# Patient Record
Sex: Female | Born: 1985 | Race: White | Hispanic: No | Marital: Single | Smoking: Never smoker
Health system: Southern US, Community
[De-identification: ages and names within clinical notes are randomized; demographics above are authoritative.]

---

## 2003-12-21 ENCOUNTER — Emergency Department (HOSPITAL_COMMUNITY): Admission: EM | Admit: 2003-12-21 | Discharge: 2003-12-22 | Payer: Self-pay | Admitting: Emergency Medicine

## 2004-11-30 ENCOUNTER — Encounter: Admission: RE | Admit: 2004-11-30 | Discharge: 2004-11-30 | Payer: Self-pay | Admitting: Obstetrics and Gynecology

## 2004-12-06 ENCOUNTER — Ambulatory Visit: Payer: Self-pay | Admitting: Gastroenterology

## 2004-12-16 ENCOUNTER — Ambulatory Visit: Payer: Self-pay | Admitting: Gastroenterology

## 2005-05-07 ENCOUNTER — Emergency Department (HOSPITAL_COMMUNITY): Admission: AD | Admit: 2005-05-07 | Discharge: 2005-05-07 | Payer: Self-pay | Admitting: Family Medicine

## 2005-10-03 ENCOUNTER — Emergency Department (HOSPITAL_COMMUNITY): Admission: EM | Admit: 2005-10-03 | Discharge: 2005-10-03 | Payer: Self-pay | Admitting: Family Medicine

## 2005-12-13 ENCOUNTER — Other Ambulatory Visit: Admission: RE | Admit: 2005-12-13 | Discharge: 2005-12-13 | Payer: Self-pay | Admitting: Obstetrics and Gynecology

## 2006-04-03 ENCOUNTER — Emergency Department (HOSPITAL_COMMUNITY): Admission: EM | Admit: 2006-04-03 | Discharge: 2006-04-03 | Payer: Self-pay | Admitting: Family Medicine

## 2006-05-25 ENCOUNTER — Emergency Department (HOSPITAL_COMMUNITY): Admission: EM | Admit: 2006-05-25 | Discharge: 2006-05-25 | Payer: Self-pay | Admitting: Family Medicine

## 2006-07-11 ENCOUNTER — Emergency Department (HOSPITAL_COMMUNITY): Admission: EM | Admit: 2006-07-11 | Discharge: 2006-07-11 | Payer: Self-pay | Admitting: Emergency Medicine

## 2006-07-29 ENCOUNTER — Emergency Department (HOSPITAL_COMMUNITY): Admission: EM | Admit: 2006-07-29 | Discharge: 2006-07-29 | Payer: Self-pay | Admitting: Emergency Medicine

## 2006-10-17 ENCOUNTER — Emergency Department (HOSPITAL_COMMUNITY): Admission: EM | Admit: 2006-10-17 | Discharge: 2006-10-17 | Payer: Self-pay | Admitting: Emergency Medicine

## 2006-12-11 ENCOUNTER — Emergency Department (HOSPITAL_COMMUNITY): Admission: EM | Admit: 2006-12-11 | Discharge: 2006-12-12 | Payer: Self-pay | Admitting: Emergency Medicine

## 2007-07-14 ENCOUNTER — Emergency Department (HOSPITAL_COMMUNITY): Admission: EM | Admit: 2007-07-14 | Discharge: 2007-07-14 | Payer: Self-pay | Admitting: Family Medicine

## 2007-09-29 ENCOUNTER — Emergency Department (HOSPITAL_COMMUNITY): Admission: EM | Admit: 2007-09-29 | Discharge: 2007-09-29 | Payer: Self-pay | Admitting: Emergency Medicine

## 2007-12-06 ENCOUNTER — Emergency Department (HOSPITAL_COMMUNITY): Admission: EM | Admit: 2007-12-06 | Discharge: 2007-12-06 | Payer: Self-pay | Admitting: Emergency Medicine

## 2008-03-11 ENCOUNTER — Emergency Department (HOSPITAL_COMMUNITY): Admission: EM | Admit: 2008-03-11 | Discharge: 2008-03-11 | Payer: Self-pay | Admitting: Family Medicine

## 2008-05-26 ENCOUNTER — Ambulatory Visit (HOSPITAL_COMMUNITY): Admission: RE | Admit: 2008-05-26 | Discharge: 2008-05-26 | Payer: Self-pay | Admitting: Urology

## 2008-06-16 ENCOUNTER — Emergency Department (HOSPITAL_COMMUNITY): Admission: EM | Admit: 2008-06-16 | Discharge: 2008-06-16 | Payer: Self-pay | Admitting: Emergency Medicine

## 2008-10-08 ENCOUNTER — Emergency Department (HOSPITAL_COMMUNITY): Admission: EM | Admit: 2008-10-08 | Discharge: 2008-10-08 | Payer: Self-pay | Admitting: Family Medicine

## 2008-11-28 ENCOUNTER — Emergency Department (HOSPITAL_COMMUNITY): Admission: EM | Admit: 2008-11-28 | Discharge: 2008-11-28 | Payer: Self-pay | Admitting: Family Medicine

## 2010-08-28 LAB — POCT URINALYSIS DIP (DEVICE)
Glucose, UA: NEGATIVE mg/dL
Ketones, ur: NEGATIVE mg/dL
Specific Gravity, Urine: 1.025 (ref 1.005–1.030)

## 2010-08-28 LAB — POCT PREGNANCY, URINE: Preg Test, Ur: NEGATIVE

## 2010-08-30 LAB — POCT URINALYSIS DIP (DEVICE)
Bilirubin Urine: NEGATIVE
Glucose, UA: NEGATIVE mg/dL
Hgb urine dipstick: NEGATIVE
Ketones, ur: NEGATIVE mg/dL
Nitrite: NEGATIVE
Protein, ur: NEGATIVE mg/dL
Specific Gravity, Urine: 1.015 (ref 1.005–1.030)
Urobilinogen, UA: 1 mg/dL (ref 0.0–1.0)

## 2010-08-30 LAB — POCT PREGNANCY, URINE: Preg Test, Ur: NEGATIVE

## 2010-08-30 LAB — URINE CULTURE

## 2011-02-10 LAB — GC/CHLAMYDIA PROBE AMP, GENITAL: Chlamydia, DNA Probe: NEGATIVE

## 2011-02-10 LAB — POCT URINALYSIS DIP (DEVICE)
Nitrite: NEGATIVE
Protein, ur: NEGATIVE
Urobilinogen, UA: 0.2

## 2011-02-17 LAB — POCT URINALYSIS DIP (DEVICE)
Bilirubin Urine: NEGATIVE
Hgb urine dipstick: NEGATIVE
Nitrite: NEGATIVE
Operator id: 247071
Protein, ur: NEGATIVE
Specific Gravity, Urine: 1.015

## 2011-02-20 LAB — URINE CULTURE: Colony Count: 25000

## 2011-02-20 LAB — POCT URINALYSIS DIP (DEVICE)
Glucose, UA: NEGATIVE
Protein, ur: NEGATIVE
Urobilinogen, UA: 0.2
pH: 7

## 2011-03-06 LAB — BASIC METABOLIC PANEL
BUN: 10
CO2: 25
Calcium: 10
Chloride: 108
Creatinine, Ser: 0.74
GFR calc Af Amer: >60
GFR calc non Af Amer: >60
Glucose, Bld: 114 — ABNORMAL HIGH
Potassium: 4.1
Sodium: 141

## 2011-03-06 LAB — URINALYSIS, ROUTINE W REFLEX MICROSCOPIC
Bilirubin Urine: NEGATIVE
Glucose, UA: NEGATIVE
Ketones, ur: NEGATIVE
Leukocytes, UA: NEGATIVE
Nitrite: NEGATIVE
Protein, ur: NEGATIVE
Specific Gravity, Urine: 1.031 — ABNORMAL HIGH
Urobilinogen, UA: 0.2
pH: 5.5

## 2011-03-06 LAB — PREGNANCY, URINE: Preg Test, Ur: NEGATIVE

## 2011-03-06 LAB — CBC
HCT: 38.6
Hemoglobin: 13.8
MCHC: 35.7
Platelets: 232

## 2011-03-06 LAB — URINE MICROSCOPIC-ADD ON

## 2011-03-06 LAB — DIFFERENTIAL
Basophils Absolute: 0
Eosinophils Relative: 1
Lymphocytes Relative: 57 — ABNORMAL HIGH
Neutro Abs: 1.8
Neutrophils Relative %: 32 — ABNORMAL LOW

## 2015-03-13 ENCOUNTER — Emergency Department (HOSPITAL_BASED_OUTPATIENT_CLINIC_OR_DEPARTMENT_OTHER): Payer: 59

## 2015-03-13 ENCOUNTER — Encounter (HOSPITAL_BASED_OUTPATIENT_CLINIC_OR_DEPARTMENT_OTHER): Payer: Self-pay | Admitting: *Deleted

## 2015-03-13 ENCOUNTER — Emergency Department (HOSPITAL_BASED_OUTPATIENT_CLINIC_OR_DEPARTMENT_OTHER)
Admission: EM | Admit: 2015-03-13 | Discharge: 2015-03-14 | Disposition: A | Discharge status: Home / Self Care | Payer: 59 | Attending: Emergency Medicine | Admitting: Emergency Medicine

## 2015-03-13 DIAGNOSIS — R3915 Urgency of urination: Secondary | ICD-10-CM | POA: Diagnosis not present

## 2015-03-13 DIAGNOSIS — Z3202 Encounter for pregnancy test, result negative: Secondary | ICD-10-CM | POA: Diagnosis not present

## 2015-03-13 DIAGNOSIS — Z792 Long term (current) use of antibiotics: Secondary | ICD-10-CM | POA: Insufficient documentation

## 2015-03-13 DIAGNOSIS — Z79899 Other long term (current) drug therapy: Secondary | ICD-10-CM | POA: Insufficient documentation

## 2015-03-13 DIAGNOSIS — R1031 Right lower quadrant pain: Secondary | ICD-10-CM | POA: Diagnosis present

## 2015-03-13 DIAGNOSIS — R110 Nausea: Secondary | ICD-10-CM | POA: Insufficient documentation

## 2015-03-13 LAB — URINALYSIS, ROUTINE W REFLEX MICROSCOPIC
GLUCOSE, UA: NEGATIVE mg/dL
HGB URINE DIPSTICK: NEGATIVE
Ketones, ur: 40 mg/dL — AB
Nitrite: POSITIVE — AB
Protein, ur: 30 mg/dL — AB
SPECIFIC GRAVITY, URINE: 1.025 (ref 1.005–1.030)
Urobilinogen, UA: 4 mg/dL — ABNORMAL HIGH (ref 0.0–1.0)
pH: 5 (ref 5.0–8.0)

## 2015-03-13 LAB — URINE MICROSCOPIC-ADD ON

## 2015-03-13 LAB — PREGNANCY, URINE: Preg Test, Ur: NEGATIVE

## 2015-03-13 MED ORDER — ONDANSETRON 8 MG PO TBDP: 8.0000 mg | ORAL_TABLET | Freq: Three times a day (TID) | ORAL | Status: DC | PRN

## 2015-03-13 NOTE — ED Notes (Signed)
Pt with right lower abd /pelvic pressure and right flank pain. Has been treated for recurrent UTI x 6 weeks with multiple abx. Reports nausea as well. Denies burning with urination fever vag bleeding or DC. - pelvic x 2 in last 2 weeks.

## 2015-03-13 NOTE — ED Provider Notes (Addendum)
CSN: [id number]     Arrival date & time 03/13/15  2138 History  By signing my name below, I, Gloria Davila, attest that this documentation has been prepared under the direction and in the presence of Gloria Libra, MD. Electronically Signed: Ronney Davila, ED Scribe. 03/13/2015. 11:55 PM.    Chief Complaint  Patient presents with  . Abdominal Pain    The history is provided by the patient.    HPI Comments: Gloria Davila is a 29 y.o. female who presents to the Emergency Department complaining of intermittent mild to moderate RLQ pain with pelvic pressure that began 2 months ago. Associated symptoms include  urinary urgency, and nausea. She states the RLQ pain seems unrelated to her urinary symptoms. She reports she was treated for a recurrent UTI over the past 2 months with Keflex, Septra and Levaquin, respectively. She notes that her latest UTI symptoms occurred 3 days ago after having a pap smear done, so she has been taking doxycycline to try to treat it. She notes she was treated for yeast infection earlier this month. Patient has had urine cultures done which came back negative.  Denies burning with urination, fever, chills, vomiting, vaginal bleeding or vaginal discharge.  History reviewed. No pertinent past medical history. History reviewed. No pertinent past surgical history. History reviewed. No pertinent family history. Social History  Substance Use Topics  . Smoking status: Never Smoker   . Smokeless tobacco: None  . Alcohol Use: No   OB History    No data available     Review of Systems A complete 10 system review of systems was obtained and all systems are negative except as noted in the HPI and PMH.    Allergies  Levaquin  Home Medications   Prior to Admission medications   Medication Sig Start Date End Date Taking? Authorizing Provider  lactobacillus acidophilus (BACID) TABS tablet Take 2 tablets by mouth 3 (three) times daily.   Yes Historical Provider, MD  Multiple  Vitamins-Minerals (MULTIVITAMIN WITH MINERALS) tablet Take 1 tablet by mouth daily.   Yes Historical Provider, MD  omeprazole (PRILOSEC) 40 MG capsule Take 40 mg by mouth daily.   Yes Historical Provider, MD  phentermine 15 MG capsule Take 15 mg by mouth every morning.   Yes Historical Provider, MD  sulfamethoxazole-trimethoprim (BACTRIM DS,SEPTRA DS) 800-160 MG tablet Take 1 tablet by mouth 2 (two) times daily. Takes 1/2tablet daily to prevent UTI   Yes Historical Provider, MD   BP 144/82 mmHg  Pulse 98  Temp(Src) 98.2 F (36.8 C) (Oral)  Resp 20  Ht  (1.6 m)  Wt 284 lb (128.822 kg)  BMI 50.32 kg/m2  SpO2 99%  LMP 02/20/2015 Physical Exam  Nursing note and vitals reviewed.   General: Well-developed, well-nourished female in no acute distress; appearance consistent with age of record HENT: normocephalic; atraumatic Eyes: pupils equal, round and reactive to light; extraocular muscles intact Neck: supple Heart: regular rate and rhythm; no murmurs, rubs or gallops Lungs: clear to auscultation bilaterally Abdomen: RLQ tenderness; soft; nondistended; no masses or hepatosplenomegaly; bowel sounds present Extremities: No deformity; full range of motion; pulses normal Neurologic: Awake, alert and oriented; motor function intact in all extremities and symmetric; no facial droop Skin: Warm and dry Psychiatric: Normal mood and affect  ED Course  Procedures (including critical care time)  DIAGNOSTIC STUDIES: Oxygen Saturation is 99% on RA, normal by my interpretation.     MDM   Nursing notes and vitals signs, including  pulse oximetry, reviewed.  Summary of this visit's results, reviewed by myself:  Labs:  Results for orders placed or performed during the hospital encounter of 03/13/15 (from the past 24 hour(s))  Urinalysis, Routine w reflex microscopic (not at [facility])     Status: Abnormal   Collection Time: 03/13/15  9:55 PM  Result Value Ref Range   Color, Urine RED (A) YELLOW    APPearance CLOUDY (A) CLEAR   Specific Gravity, Urine 1.025 1.005 - 1.030   pH 5.0 5.0 - 8.0   Glucose, UA NEGATIVE NEGATIVE mg/dL   Hgb urine dipstick NEGATIVE NEGATIVE   Bilirubin Urine MODERATE (A) NEGATIVE   Ketones, ur 40 (A) NEGATIVE mg/dL   Protein, ur 30 (A) NEGATIVE mg/dL   Urobilinogen, UA 4.0 (H) 0.0 - 1.0 mg/dL   Nitrite POSITIVE (A) NEGATIVE   Leukocytes, UA MODERATE (A) NEGATIVE  Pregnancy, urine     Status: None   Collection Time: 03/13/15  9:55 PM  Result Value Ref Range   Preg Test, Ur NEGATIVE NEGATIVE  Urine microscopic-add on     Status: Abnormal   Collection Time: 03/13/15  9:55 PM  Result Value Ref Range   Squamous Epithelial / LPF FEW (A) RARE   WBC, UA 0-2 <3 WBC/hpf   RBC / HPF 0-2 <3 RBC/hpf   Bacteria, UA MANY (A) RARE    Imaging Studies: [place] Transvaginal Non-ob  03/13/2015  CLINICAL DATA:  Initial valuation for intermittent right lower quadrant pain for 2 months. History of chronic UTIs. EXAM: TRANSABDOMINAL AND TRANSVAGINAL ULTRASOUND OF PELVIS TECHNIQUE: Both transabdominal and transvaginal ultrasound examinations of the pelvis were performed. Transabdominal technique was performed for global imaging of the pelvis including uterus, ovaries, adnexal regions, and pelvic cul-de-sac. It was necessary to proceed with endovaginal exam following the transabdominal exam to visualize the uterus and ovaries. COMPARISON:  Prior CT from 05/25/2008 FINDINGS: Uterus Measurements: 8.1 x 3.9 x 4.6 cm. No fibroids or other mass visualized. Endometrium Thickness: 7.7 mm.  No focal abnormality visualized. Right ovary Measurements: 3.4 x 2.2 x 2.2 cm. Normal appearance/no adnexal mass. Left ovary Measurements: 2.2 x 1.4 x 2.1 cm. Normal appearance/no adnexal mass. Other findings No free fluid. IMPRESSION: Normal pelvic ultrasound.  No acute abnormality identified. Electronically Signed   By: [name]  McClintock M.D.   On: 03/13/2015 23:43   [place] Pelvis Complete  03/13/2015   CLINICAL DATA:  Initial valuation for intermittent right lower quadrant pain for 2 months. History of chronic UTIs. EXAM: TRANSABDOMINAL AND TRANSVAGINAL ULTRASOUND OF PELVIS TECHNIQUE: Both transabdominal and transvaginal ultrasound examinations of the pelvis were performed. Transabdominal technique was performed for global imaging of the pelvis including uterus, ovaries, adnexal regions, and pelvic cul-de-sac. It was necessary to proceed with endovaginal exam following the transabdominal exam to visualize the uterus and ovaries. COMPARISON:  Prior CT from 05/25/2008 FINDINGS: Uterus Measurements: 8.1 x 3.9 x 4.6 cm. No fibroids or other mass visualized. Endometrium Thickness: 7.7 mm.  No focal abnormality visualized. Right ovary Measurements: 3.4 x 2.2 x 2.2 cm. Normal appearance/no adnexal mass. Left ovary Measurements: 2.2 x 1.4 x 2.1 cm. Normal appearance/no adnexal mass. Other findings No free fluid. IMPRESSION: Normal pelvic ultrasound.  No acute abnormality identified. Electronically Signed   By: [name]  McClintock M.D.   On: 03/13/2015 23:43   11:55 PM Patient was advised of unremarkable ultrasound findings and urinalysis not diagnostic of a urinary tract infection. It was sent for culture but antibiotics are not indicated at this time  especially given she has had several antibiotics without relief of symptoms and without positive urine cultures.   Final diagnoses:  RLQ abdominal pain   I personally performed the services described in this documentation, which was scribed in my presence. The recorded information has been reviewed and is accurate.    Gloria Libra, MD 03/13/15 2357  Gloria Libra, MD 03/14/15 Marlyne Beards

## 2015-03-14 NOTE — ED Notes (Signed)
Patient stable and ambulatory. Patient verbalizes understanding of discharge instructions and follow-up. 

## 2015-03-16 LAB — URINE CULTURE

## 2016-02-08 ENCOUNTER — Ambulatory Visit (HOSPITAL_BASED_OUTPATIENT_CLINIC_OR_DEPARTMENT_OTHER)
Admission: RE | Admit: 2016-02-08 | Discharge: 2016-02-08 | Disposition: A | Discharge status: Home / Self Care | Payer: BLUE CROSS/BLUE SHIELD | Source: Ambulatory Visit | Attending: Podiatry | Admitting: Podiatry

## 2016-02-08 ENCOUNTER — Ambulatory Visit (INDEPENDENT_AMBULATORY_CARE_PROVIDER_SITE_OTHER): Payer: BLUE CROSS/BLUE SHIELD | Admitting: Podiatry

## 2016-02-08 ENCOUNTER — Encounter: Payer: Self-pay | Admitting: Podiatry

## 2016-02-08 DIAGNOSIS — M7731 Calcaneal spur, right foot: Secondary | ICD-10-CM | POA: Insufficient documentation

## 2016-02-08 DIAGNOSIS — M722 Plantar fascial fibromatosis: Secondary | ICD-10-CM | POA: Diagnosis not present

## 2016-02-08 DIAGNOSIS — R52 Pain, unspecified: Secondary | ICD-10-CM | POA: Diagnosis not present

## 2016-02-08 MED ORDER — METHYLPREDNISOLONE 4 MG PO TBPK: ORAL_TABLET | ORAL | 0 refills | Status: AC

## 2016-02-08 MED ORDER — MELOXICAM 15 MG PO TABS: 15.0000 mg | ORAL_TABLET | Freq: Every day | ORAL | 2 refills | Status: AC

## 2016-02-08 NOTE — Patient Instructions (Signed)
Ice the injection spot today You can start taking the medrol dose pack (steroid). Once this is complete you can start the mobic (meloxicam). If your acid reflux gets worse, stop the mobic.    Plantar Fasciitis With Rehab The plantar fascia is a fibrous, ligament-like, soft-tissue structure that spans the bottom of the foot. Plantar fasciitis, also called heel spur syndrome, is a condition that causes pain in the foot due to inflammation of the tissue. SYMPTOMS   Pain and tenderness on the underneath side of the foot.  Pain that worsens with standing or walking. CAUSES  Plantar fasciitis is caused by irritation and injury to the plantar fascia on the underneath side of the foot. Common mechanisms of injury include:  Direct trauma to bottom of the foot.  Damage to a small nerve that runs under the foot where the main fascia attaches to the heel bone.  Stress placed on the plantar fascia due to bone spurs. RISK INCREASES WITH:   Activities that place stress on the plantar fascia (running, jumping, pivoting, or cutting).  Poor strength and flexibility.  Improperly fitted shoes.  Tight calf muscles.  Flat feet.  Failure to warm-up properly before activity.  Obesity. PREVENTION  Warm up and stretch properly before activity.  Allow for adequate recovery between workouts.  Maintain physical fitness:  Strength, flexibility, and endurance.  Cardiovascular fitness.  Maintain a health body weight.  Avoid stress on the plantar fascia.  Wear properly fitted shoes, including arch supports for individuals who have flat feet. PROGNOSIS  If treated properly, then the symptoms of plantar fasciitis usually resolve without surgery. However, occasionally surgery is necessary. RELATED COMPLICATIONS   Recurrent symptoms that may result in a chronic condition.  Problems of the lower back that are caused by compensating for the injury, such as limping.  Pain or weakness of the foot  during push-off following surgery.  Chronic inflammation, scarring, and partial or complete fascia tear, occurring more often from repeated injections. TREATMENT  Treatment initially involves the use of ice and medication to help reduce pain and inflammation. The use of strengthening and stretching exercises may help reduce pain with activity, especially stretches of the Achilles tendon. These exercises may be performed at home or with a therapist. Your caregiver may recommend that you use heel cups of arch supports to help reduce stress on the plantar fascia. Occasionally, corticosteroid injections are given to reduce inflammation. If symptoms persist for greater than 6 months despite non-surgical (conservative), then surgery may be recommended.  MEDICATION   If pain medication is necessary, then nonsteroidal anti-inflammatory medications, such as aspirin and ibuprofen, or other minor pain relievers, such as acetaminophen, are often recommended.  Do not take pain medication within 7 days before surgery.  Prescription pain relievers may be given if deemed necessary by your caregiver. Use only as directed and only as much as you need.  Corticosteroid injections may be given by your caregiver. These injections should be reserved for the most serious cases, because they may only be given a certain number of times. HEAT AND COLD  Cold treatment (icing) relieves pain and reduces inflammation. Cold treatment should be applied for 10 to 15 minutes every 2 to 3 hours for inflammation and pain and immediately after any activity that aggravates your symptoms. Use ice packs or massage the area with a piece of ice (ice massage).  Heat treatment may be used prior to performing the stretching and strengthening activities prescribed by your caregiver, physical therapist, or  Event organiser. Use a heat pack or soak the injury in warm water. SEEK IMMEDIATE MEDICAL CARE IF:  Treatment seems to offer no benefit,  or the condition worsens.  Any medications produce adverse side effects. EXERCISES RANGE OF MOTION (ROM) AND STRETCHING EXERCISES - Plantar Fasciitis (Heel Spur Syndrome) These exercises may help you when beginning to rehabilitate your injury. Your symptoms may resolve with or without further involvement from your physician, physical therapist or athletic trainer. While completing these exercises, remember:   Restoring tissue flexibility helps normal motion to return to the joints. This allows healthier, less painful movement and activity.  An effective stretch should be held for at least 30 seconds.  A stretch should never be painful. You should only feel a gentle lengthening or release in the stretched tissue. RANGE OF MOTION - Toe Extension, Flexion  Sit with your right / left leg crossed over your opposite knee.  Grasp your toes and gently pull them back toward the top of your foot. You should feel a stretch on the bottom of your toes and/or foot.  Hold this stretch for __________ seconds.  Now, gently pull your toes toward the bottom of your foot. You should feel a stretch on the top of your toes and or foot.  Hold this stretch for __________ seconds. Repeat __________ times. Complete this stretch __________ times per day.  RANGE OF MOTION - Ankle Dorsiflexion, Active Assisted  Remove shoes and sit on a chair that is preferably not on a carpeted surface.  Place right / left foot under knee. Extend your opposite leg for support.  Keeping your heel down, slide your right / left foot back toward the chair until you feel a stretch at your ankle or calf. If you do not feel a stretch, slide your bottom forward to the edge of the chair, while still keeping your heel down.  Hold this stretch for __________ seconds. Repeat __________ times. Complete this stretch __________ times per day.  STRETCH - Gastroc, Standing  Place hands on wall.  Extend right / left leg, keeping the front  knee somewhat bent.  Slightly point your toes inward on your back foot.  Keeping your right / left heel on the floor and your knee straight, shift your weight toward the wall, not allowing your back to arch.  You should feel a gentle stretch in the right / left calf. Hold this position for __________ seconds. Repeat __________ times. Complete this stretch __________ times per day. STRETCH - Soleus, Standing  Place hands on wall.  Extend right / left leg, keeping the other knee somewhat bent.  Slightly point your toes inward on your back foot.  Keep your right / left heel on the floor, bend your back knee, and slightly shift your weight over the back leg so that you feel a gentle stretch deep in your back calf.  Hold this position for __________ seconds. Repeat __________ times. Complete this stretch __________ times per day. STRETCH - Gastrocsoleus, Standing  Note: This exercise can place a lot of stress on your foot and ankle. Please complete this exercise only if specifically instructed by your caregiver.   Place the ball of your right / left foot on a step, keeping your other foot firmly on the same step.  Hold on to the wall or a rail for balance.  Slowly lift your other foot, allowing your body weight to press your heel down over the edge of the step.  You should feel a  stretch in your right / left calf.  Hold this position for __________ seconds.  Repeat this exercise with a slight bend in your right / left knee. Repeat __________ times. Complete this stretch __________ times per day.  STRENGTHENING EXERCISES - Plantar Fasciitis (Heel Spur Syndrome)  These exercises may help you when beginning to rehabilitate your injury. They may resolve your symptoms with or without further involvement from your physician, physical therapist or athletic trainer. While completing these exercises, remember:   Muscles can gain both the endurance and the strength needed for everyday  activities through controlled exercises.  Complete these exercises as instructed by your physician, physical therapist or athletic trainer. Progress the resistance and repetitions only as guided. STRENGTH - Towel Curls  Sit in a chair positioned on a non-carpeted surface.  Place your foot on a towel, keeping your heel on the floor.  Pull the towel toward your heel by only curling your toes. Keep your heel on the floor.  If instructed by your physician, physical therapist or athletic trainer, add ____________________ at the end of the towel. Repeat __________ times. Complete this exercise __________ times per day. STRENGTH - Ankle Inversion  Secure one end of a rubber exercise band/tubing to a fixed object (table, pole). Loop the other end around your foot just before your toes.  Place your fists between your knees. This will focus your strengthening at your ankle.  Slowly, pull your big toe up and in, making sure the band/tubing is positioned to resist the entire motion.  Hold this position for __________ seconds.  Have your muscles resist the band/tubing as it slowly pulls your foot back to the starting position. Repeat __________ times. Complete this exercises __________ times per day.    This information is not intended to replace advice given to you by your health care provider. Make sure you discuss any questions you have with your health care provider.   Document Released: 05/08/2005 Document Revised: 09/22/2014 Document Reviewed: 08/20/2008 Elsevier Interactive Patient Education [employer]

## 2016-02-08 NOTE — Progress Notes (Signed)
   Subjective:    Patient ID: Gloria Davila, female    DOB: 04-05-1986, 30 y.o.   MRN: [id number]  HPI  30 year old female presents the office today for concerns of right heel pain which has been ongoing for 6-12 months. She states the pain started after she was running out of boot camp and she was not wearing shoes. Her trainer did send her to physical therapy and she did this for some time which included stretching, dry needling as well as a walking boot without significant improvement in symptoms. Denies any swelling or redness. No numbness or tingling. The pain does not wake up at night. She's had no other treatment. She describes the pain mostly in the morning when she first gets up and at the end of the day after being on her feet all day.   Review of Systems  All other systems reviewed and are negative.      Objective:   Physical Exam General: AAO x3, NAD  Dermatological: Skin is warm, dry and supple bilateral. Nails x 10 are well manicured; remaining integument appears unremarkable at this time. There are no open sores, no preulcerative lesions, no rash or signs of infection present.  Vascular: Dorsalis Pedis artery and Posterior Tibial artery pedal pulses are 2/4 bilateral with immedate capillary fill time.There is no pain with calf compression, swelling, warmth, erythema.   Neruologic: Grossly intact via light touch bilateral. Vibratory intact via tuning fork bilateral. Protective threshold with Semmes Wienstein monofilament intact to all pedal sites bilateral.   Musculoskeletal: Tenderness to palpation along the plantar medial tubercle of the calcaneus at the insertion of plantar fascia on the right foot. There is no pain along the course of the plantar fascia within the arch of the foot. Plantar fascia appears to be intact. There is no pain with lateral compression of the calcaneus or pain with vibratory sensation. There is no pain along the course or insertion of the achilles tendon.  No other areas of tenderness to bilateral lower extremities. Muscular strength 5/5 in all groups tested bilateral.  Gait: Unassisted, Nonantalgic.      Assessment & Plan:  30 year old female right heel pain, likely plantar fasciitis, heel spur -Treatment options discussed including all alternatives, risks, and complications -Etiology of symptoms were discussed -X-rays ordered and reviewed with the patient. -Patient elects to proceed with steroid injection into the right heel. Under sterile skin preparation, a total of 2.5cc of kenalog 10, 0.5% Marcaine plain, and 2% lidocaine plain were infiltrated into the symptomatic area without complication. A band-aid was applied. Patient tolerated the injection well without complication. Post-injection care with discussed with the patient. Discussed with the patient to ice the area over the next couple of days to help prevent a steroid flare.  -Will start Medrol Dosepak. Once this is complete she can start meloxicam. Should not it together. Discussed side effects of each of the medicines. -Ice to the area -Stretching exercises daily -Plantar fascial brace dispensed -Discussed shoe gear modifications and orthotics. She wishes to proceed with custom inserts days. [facility] -Follow-up in 4 weeks or sooner if needed. Call any questions or concerns meantime.  Ovid CurdMatthew Wagoner, DPM

## 2016-03-07 ENCOUNTER — Encounter: Payer: Self-pay | Admitting: Podiatry

## 2016-03-07 ENCOUNTER — Ambulatory Visit (INDEPENDENT_AMBULATORY_CARE_PROVIDER_SITE_OTHER): Payer: BLUE CROSS/BLUE SHIELD | Admitting: Podiatry

## 2016-03-07 DIAGNOSIS — M722 Plantar fascial fibromatosis: Secondary | ICD-10-CM | POA: Diagnosis not present

## 2016-03-07 NOTE — Progress Notes (Signed)
Subjective: Gloria Davila presents to the office today for follow-up evaluation of right heel pain. She states that she is doing much better but she is still having some pain in the right heel and she is requesting another steroid injection. She has been continuing stretching she still feels tight her calf muscle and she stretches. Her plantar fascial brace to break the other day. She has a presents a pickup orthotics. No other complaints at this time. No acute changes since last appointment. They deny any systemic complaints such as fevers, chills, nausea, vomiting.  Objective: General: AAO x3, NAD  Dermatological: Skin is warm, dry and supple bilateral. Nails x 10 are well manicured; remaining integument appears unremarkable at this time. There are no open sores, no preulcerative lesions, no rash or signs of infection present.  Vascular: Dorsalis Pedis artery and Posterior Tibial artery pedal pulses are 2/4 bilateral with immedate capillary fill time. Pedal hair growth present. There is no pain with calf compression, swelling, warmth, erythema.   Neruologic: Grossly intact via light touch bilateral. Vibratory intact via tuning fork bilateral. Protective threshold with Semmes Wienstein monofilament intact to all pedal sites bilateral.   Musculoskeletal: There is improved but continued tenderness palpation along the plantar medial tubercle of the calcaneus at the insertion of the plantar fascia on the left  foot. There is no pain along the course of the plantar fascia within the arch of the foot. Plantar fascia appears to be intact bilaterally. There is no pain with lateral compression of the calcaneus and there is no pain with vibratory sensation. There is no pain along the course or insertion of the Achilles tendon. There are no other areas of tenderness to bilateral lower extremities. No gross boney pedal deformities bilateral. No pain, crepitus, or limitation noted with foot and ankle range of motion  bilateral. Muscular strength 5/5 in all groups tested bilateral.  Gait: Unassisted, Nonantalgic.   Assessment: Presents for follow-up evaluation for heel pain, likely plantar fasciitis; heel spur  Plan: -Treatment options discussed including all alternatives, risks, and complications -Patient elects to proceed with steroid injection into the left heel. Under sterile skin preparation, a total of 2.5cc of kenalog 10, 0.5% Marcaine plain, and 2% lidocaine plain were infiltrated into the symptomatic area without complication. A band-aid was applied. Patient tolerated the injection well without complication. Post-injection care with discussed with the patient. Discussed with the patient to ice the area over the next couple of days to help prevent a steroid flare.  -Night splint -Orthotics dispensed. Oral and written break-in instructions discussed today.  -Ice and stretching exercises on a daily basis. -Continue supportive shoe gear. -Follow-up in 4 weeks or sooner if any problems arise. In the meantime, encouraged to call the office with any questions, concerns, change in symptoms.   Ovid CurdMatthew Wagoner, DPM

## 2016-03-07 NOTE — Patient Instructions (Signed)

## 2016-04-04 ENCOUNTER — Encounter: Payer: Self-pay | Admitting: Podiatry

## 2016-04-04 ENCOUNTER — Ambulatory Visit (INDEPENDENT_AMBULATORY_CARE_PROVIDER_SITE_OTHER): Payer: BLUE CROSS/BLUE SHIELD | Admitting: Podiatry

## 2016-04-04 DIAGNOSIS — M722 Plantar fascial fibromatosis: Secondary | ICD-10-CM | POA: Diagnosis not present

## 2016-04-04 DIAGNOSIS — M779 Enthesopathy, unspecified: Secondary | ICD-10-CM | POA: Diagnosis not present

## 2016-04-05 NOTE — Progress Notes (Signed)
Subjective: Patient presents the office they for evaluation of bilateral heel pain. She states her heels no longer hurt her however the pain has moved to the outside aspect of the right foot. She doesn't wear the insert some but she feels that she is still rolling inwards. She has been stretching, icing as well. She is scheduled for bariatric surgery in January she was told that she did not have any more steroid injections. Denies any systemic complaints such as fevers, chills, nausea, vomiting. No acute changes since last appointment, and no other complaints at this time.   Objective: AAO x3, NAD DP/PT pulses palpable bilaterally, CRT less than 3 seconds At this time there is no tenderness palpation along the heels along the course of plantar fascia. There is no pain at all to the left foot today. On the right foot there is mild tenderness in the course of the peroneal tendons just proximal to the insertion of the fifth metatarsal base. The peroneal tendons to be intact. There is no overlying edema, erythema, increase in warmth. No other areas of tenderness bilaterally. No open lesions or pre-ulcerative lesions.  No pain with calf compression, swelling, warmth, erythema  Assessment: Peroneal tendinitis right foot with resolving pain/plan fasciitis  Plan: -All treatment options discussed with the patient including all alternatives, risks, complications.  -I did modify her inserts to add a medial post as she was still rolling in some the orthotic. Also discussed shoe changes. She's been wearing an old Engineer, petroleumike shoe. Continue ice. Anti-inflammatories until she was told to discontinue for her surgery. Also discussed physical therapy which she was told off on this. -Follow-up next 4-6 weeks if symptoms continue or sooner if needed. -Patient encouraged to call the office with any questions, concerns, change in symptoms.   Ovid CurdMatthew Wagoner, DPM

## 2017-06-28 ENCOUNTER — Encounter: Payer: Self-pay | Admitting: Podiatry

## 2017-06-28 ENCOUNTER — Ambulatory Visit: Payer: BLUE CROSS/BLUE SHIELD | Admitting: Podiatry

## 2017-06-28 DIAGNOSIS — M722 Plantar fascial fibromatosis: Secondary | ICD-10-CM | POA: Diagnosis not present

## 2017-06-28 NOTE — Patient Instructions (Signed)

## 2017-06-29 NOTE — Progress Notes (Signed)
Subjective: Margarett presents the office today for concerns of left heel pain.  She states that her right foot is 100% better but over the last 3 months she started to notice pain to the left foot.  She denies any recent injury or trauma.  She states the pain is worse in the morning she first gets up.  She states it feels exact same as it did on the right foot.  No numbness or tingling the pain does not wake her up at night. Denies any systemic complaints such as fevers, chills, nausea, vomiting. No acute changes since last appointment, and no other complaints at this time.   She recently has lost 90 pounds  Objective: AAO x3, NAD DP/PT pulses palpable bilaterally, CRT less than 3 seconds There is tenderness to palpation along the plantar medial tubercle of the calcaneus at the insertion of plantar fascia on the left foot. There is no pain to the right foot. There is no pain along the course of the plantar fascia within the arch of the foot. Plantar fascia appears to be intact. There is no pain with lateral compression of the calcaneus or pain with vibratory sensation. There is no pain along the course or insertion of the achilles tendon. No other areas of tenderness to bilateral lower extremities. Negative tinel sign.  Equinus is present. No open lesions or pre-ulcerative lesions.  No pain with calf compression, swelling, warmth, erythema  Assessment: Left plantar fasciitis  Plan: -All treatment options discussed with the patient including all alternatives, risks, complications.  -Declined x-rays today.  She states that she knows that this is the same as the right foot. -Patient requesting steroid injection today which were performed.  See procedure note below. -Plantar fascial brace was dispensed to the left foot. -Continue stretching, icing exercises daily. -We will consider physical therapy.  She did do this previously before I saw her on the right side.  Given her colitis this may be  beneficial for her. -Follow-up in 3 weeks or sooner if needed.  Call any questions or concerns meantime.  She has no further questions today. -Patient encouraged to call the office with any questions, concerns, change in symptoms.   Procedure: Injection Tendon/Ligament Discussed alternatives, risks, complications and verbal consent was obtained.  Location: Left plantar fascia at the glabrous junction; medial approach. Skin Prep: Alcohol. Injectate: 1 cc 0.5% marcaine plain, 1 cc 0.5% Marcaine plain and, 1 cc kenalog 10. Disposition: Patient tolerated procedure well. Injection site dressed with a band-aid.  Post-injection care was discussed and return precautions discussed.    Vivi BarrackMatthew R Wagoner DPM

## 2017-07-20 ENCOUNTER — Ambulatory Visit: Payer: BLUE CROSS/BLUE SHIELD | Admitting: Podiatry

## 2017-07-20 DIAGNOSIS — M722 Plantar fascial fibromatosis: Secondary | ICD-10-CM

## 2017-07-20 DIAGNOSIS — M7672 Peroneal tendinitis, left leg: Secondary | ICD-10-CM | POA: Diagnosis not present

## 2017-07-20 MED ORDER — METHYLPREDNISOLONE 4 MG PO TBPK: ORAL_TABLET | ORAL | 0 refills | Status: AC

## 2017-07-20 NOTE — Progress Notes (Signed)
Subjective: Gloria Davila presents the office today for follow-up evaluation of left foot pain.  She says the heel is doing better but she is getting pain more to the outside aspect of the ankle and foot.  At max her pain level is 8/10 and describes a sharp pain at times.  No recent injury or trauma.  No swelling or redness.  She had no complications with the last steroid injection.  She is been using a plantar fascial brace as well as stretching ice as much as possible.  Her heel is feeling better but again she states that the pain the also of the foot is what starting today. Denies any systemic complaints such as fevers, chills, nausea, vomiting. No acute changes since last appointment, and no other complaints at this time.   Objective: AAO x3, NAD DP/PT pulses palpable bilaterally, CRT less than 3 seconds At this time there is no significant discomfort to the plantar medial tubercle of the calcaneus at the insertion of the plantar fascia on the left foot.  The majority of tenderness appears to be along the course of the peroneal tendon inferior to the lateral malleolus along the insertion of the fifth metatarsal base.  There is no overlying edema, erythema, increase in warmth.  The tendon appears to be intact.  Plantar fascia appears to be intact.  Achilles tendon intact.  No other area tenderness. No open lesions or pre-ulcerative lesions.  No pain with calf compression, swelling, warmth, erythema  Assessment: Left foot insertional peroneal tendinitis likely result of compensation due to plantar fasciitis  Plan: -All treatment options discussed with the patient including all alternatives, risks, complications.  -At this time discussed a steroid injection into the fifth metatarsal base as this is where the majority of her tenderness is localized today.  She agrees with this and wishes to proceed.  Area was cleaned with alcohol and a mixture of 1 cc mixture of dexamethasone phosphate and 0.5% Marcaine plain  was infiltrated around the peroneal tendon sheath with care taken not to infiltrate directly into the tendon.  Tolerated well.  Postinjection care was discussed. -Continue with supportive shoes and inserts. -Medrol Dosepak prescribed. -Can return to the rehab exercises in the next couple of days but hold off for the first 2-3 days to allow the injection to work. -Patient encouraged to call the office with any questions, concerns, change in symptoms.   Vivi BarrackMatthew R Jabier Deese DPM

## 2017-07-20 NOTE — Patient Instructions (Signed)
Peroneal Tendinopathy Rehab  Ask your health care provider which exercises are safe for you. Do exercises exactly as told by your health care provider and adjust them as directed. It is normal to feel mild stretching, pulling, tightness, or discomfort as you do these exercises, but you should stop right away if you feel sudden pain or your pain gets worse.Do not begin these exercises until told by your health care provider.  Stretching and range of motion exercises  These exercises warm up your muscles and joints and improve the movement and flexibility of your ankle. These exercises also help to relieve pain and stiffness.  Exercise A: Gastroc and soleus, standing  1. Stand on the edge of a step on the balls of your feet. The ball of your foot is on the walking surface, right under your toes.  2. Hold onto the railing for balance.  3. Slowly lift your left / right foot, allowing your body weight to press your left / right heel down over the edge of the step. You should feel a stretch in your left / right calf.  4. Hold this position for __________ seconds.  Repeat __________ times with your left / right knee straight and __________ times with your left / right knee bent. Complete this stretch __________ times per day.  Strengthening exercises  These exercises improve the strength and endurance of your foot and ankle. Endurance is the ability to use your muscles for a long time, even after they get tired.  Exercise B: Dorsiflexors    1. Secure a rubber exercise band or tube to an object, like a table leg, that will not move if it is pulled on.  2. Secure the other end of the band around your left / right foot.  3. Sit on the floor, facing the object with your left / right foot extended. The band or tube should be slightly tense when your foot is relaxed.  4. Slowly flex your left / right ankle and toes to bring your foot toward you.  5. Hold this position for __________ seconds.  6. Slowly return your foot to the  starting position.  Repeat __________ times. Complete this exercise __________ times per day.  Exercise C: Evertors  1. Sit on the floor with your legs straight out in front of you.  2. Loop a rubber exercise or band or tube around the ball of your left / right foot. The ball of your foot is on the walking surface, right under your toes.  3. Hold the ends of the band in your hands, or secure the band to a stable object.  4. Slowly push your foot outward, away from your other leg.  5. Hold this position for __________ seconds.  6. Slowly return your foot to the starting position.  Repeat __________ times. Complete this exercise __________ times per day.  Exercise D: Standing heel raise (  plantar flexion)  1. Stand with your feet shoulder-width apart with the balls of your feet on a step. The ball of your foot is on the walking surface, right under your toes.  2. Keep your weight spread evenly over the width of your feet while you rise up on your toes. Use a wall or railing to steady yourself, but try not to use it for support.  3. If this exercise is too easy, try these options:  ? Shift your weight toward your left / right leg until you feel challenged.  ? If told by   your health care provider, stand on your left / right leg only.  4. Hold this position for __________ seconds.  Repeat __________ times. Complete this exercise __________ times per day.  Exercise E: Single leg stand  1. Without shoes, stand near a railing or in a doorway. You may hold onto the railing or door frame as needed.  2. Stand on your left / right foot. Keep your big toe down on the floor and try to keep your arch lifted.  ? Do not roll to the outside of your foot.  ? If this exercise is too easy, you can try it with your eyes closed or while standing on a pillow.  3. Hold this position for __________ seconds.  Repeat __________ times. Complete this exercise __________ times per day.  This information is not intended to replace advice given to  you by your health care provider. Make sure you discuss any questions you have with your health care provider.  Document Released: 05/08/2005 Document Revised: 01/13/2016 Document Reviewed: 03/27/2015  [employer]vier Interactive Patient Education  2018 [employer]

## 2017-08-20 ENCOUNTER — Encounter: Payer: Self-pay | Admitting: Podiatry

## 2017-08-20 ENCOUNTER — Ambulatory Visit: Payer: BLUE CROSS/BLUE SHIELD | Admitting: Podiatry

## 2017-08-20 DIAGNOSIS — M722 Plantar fascial fibromatosis: Secondary | ICD-10-CM | POA: Diagnosis not present

## 2017-08-20 NOTE — Patient Instructions (Signed)

## 2017-08-21 NOTE — Progress Notes (Signed)
Subjective: [name] presents the office today for follow-up evaluation of continued pain to the bottom of her left heel.  She states that the tendon issue inside of her foot has resolved after last injection she only gets pain to the outside aspect of the bottom of her heel.  She is hoping to get an injection to the outside aspect of her heel today.  The pain to the inside aspect of the heel is also resolved.  She feels that when she runs or walks she is rolling outwards putting more pressure to the outside of the heel.  No recent injury.  Right side is doing well. Denies any systemic complaints such as fevers, chills, nausea, vomiting. No acute changes since last appointment, and no other complaints at this time.   Objective: AAO x3, NAD DP/PT pulses palpable bilaterally, CRT less than 3 seconds There is tenderness palpation on the plantar lateral aspect of the heel and insertion of the plantar fascia on the left foot.  Plantar fascia appears to be intact.  There is no pain with lateral compression of the calcaneus.  No pain on the course or insertion of the peroneal tendon.  There is no overlying edema, erythema, increase in warmth.  Achilles tendon appears to be intact. No open lesions or pre-ulcerative lesions.  No pain with calf compression, swelling, warmth, erythema  Assessment: Left plantar fasciitis  Plan: -All treatment options discussed with the patient including all alternatives, risks, complications.  -She wishes to proceed with another steroid injection.  See procedure note below. -I added the lateral posterior her left orthotic to help prevent her from rolling out. -Continue stretching, icing exercises daily.  Anti-inflammatories as needed.  If symptoms continue discussed other treatment options including physical therapy, EPAT -RTC 3-4 weeks or sooner if needed -Patient encouraged to call the office with any questions, concerns, change in symptoms.   Procedure: Injection  Tendon/Ligament Discussed alternatives, risks, complications and verbal consent was obtained.  Location: Left plantar fascia at the glabrous junction; LATERAL approach. Skin Prep: Alcohol. Injectate: 0.5 cc 0.5% marcaine plain, 1 cc 2 % lidocaine plain and, 1 cc kenalog 10. Disposition: Patient tolerated procedure well. Injection site dressed with a band-aid.  Post-injection care was discussed and return precautions discussed.   Vivi BarrackMatthew R Wagoner DPM

## 2017-09-24 ENCOUNTER — Ambulatory Visit: Payer: BLUE CROSS/BLUE SHIELD | Admitting: Podiatry

## 2020-01-14 ENCOUNTER — Other Ambulatory Visit: Payer: Self-pay

## 2020-01-14 ENCOUNTER — Emergency Department
Admission: EM | Admit: 2020-01-14 | Discharge: 2020-01-14 | Disposition: A | Discharge status: Home / Self Care | Payer: BC Managed Care – PPO | Source: Home / Self Care | Attending: Internal Medicine | Admitting: Internal Medicine

## 2020-01-14 DIAGNOSIS — L03114 Cellulitis of left upper limb: Secondary | ICD-10-CM | POA: Diagnosis not present

## 2020-01-14 MED ORDER — CEPHALEXIN 500 MG PO CAPS: 500.0000 mg | ORAL_CAPSULE | Freq: Four times a day (QID) | ORAL | 0 refills | Status: AC

## 2020-01-14 MED ORDER — CEFTRIAXONE SODIUM 500 MG IJ SOLR
1000.0000 mg | Freq: Once | INTRAMUSCULAR | Status: AC
  Administered 2020-01-14: 1000 mg via INTRAMUSCULAR

## 2020-01-14 NOTE — ED Provider Notes (Signed)
[facility]    CSN: [id number] Arrival date & time: 01/14/20  1321      History   Chief Complaint Chief Complaint  Patient presents with  . Hand Pain    left middle    HPI Gloria Davila is an otherwise healthy 34 y.o. female presenting for evaluation of a finger lesion.   Reports she initially noticed swelling and erythema of her left middle finger at the nail bed on Monday evening.  She was evaluated via virtual appointment yesterday and felt that it may have been herpetic whitlow and started on acyclovir.  She unfortunately did not notice any improvement with this medication and now since late yesterday evening she noticed redness spreading up her entire forearm.  Arm is warm to the touch.  Febrile on Monday and now again febrile today, 100.65F in the office.  Otherwise feeling at baseline, no associated fatigue, malaise, change in mentation, or palpitations.  States she often picks at her fingers.  Of note, recently received her first [employer] vaccination this past Friday.   History reviewed. No pertinent past medical history.  Patient Active Problem List   Diagnosis Date Noted  . Plantar fasciitis 02/08/2016    History reviewed. No pertinent surgical history.  OB History   No obstetric history on file.      Home Medications    Prior to Admission medications   Medication Sig Start Date End Date Taking? Authorizing Provider  cephALEXin (KEFLEX) 500 MG capsule Take 1 capsule (500 mg total) by mouth 4 (four) times daily for 5 days. 01/14/20 01/19/20  Allayne Stack, DO  lactobacillus acidophilus (BACID) TABS tablet Take 2 tablets by mouth 3 (three) times daily.    [provider]  methylPREDNISolone (MEDROL DOSEPAK) 4 MG TBPK tablet Take as directed 02/08/16   Vivi Barrack, DPM  methylPREDNISolone (MEDROL DOSEPAK) 4 MG TBPK tablet Take as directed 07/20/17   Vivi Barrack, DPM  Multiple Vitamins-Minerals (MULTIVITAMIN WITH MINERALS) tablet  Take 1 tablet by mouth daily.    [provider]  omeprazole (PRILOSEC) 40 MG capsule Take 40 mg by mouth daily.    [provider]  phentermine 15 MG capsule Take 15 mg by mouth every morning.    [provider]    Family History Family History  Problem Relation Age of Onset  . Healthy Mother   . Diabetes Father   . Hypertension Father     Social History Social History   Tobacco Use  . Smoking status: Never Smoker  . Smokeless tobacco: Never Used  Substance Use Topics  . Alcohol use: No  . Drug use: No     Allergies   Patient has no known allergies.   Review of Systems Review of Systems  Constitutional: Positive for fever. Negative for chills and fatigue.  Respiratory: Negative for shortness of breath.   Cardiovascular: Positive for chest pain.  Gastrointestinal: Negative for nausea and vomiting.  Skin: Positive for rash and wound.  Neurological: Negative for dizziness, syncope, weakness and light-headedness.   Physical Exam Triage Vital Signs ED Triage Vitals  Enc Vitals Group     BP 01/14/20 1401 139/86     Pulse Rate 01/14/20 1401 99     Resp 01/14/20 1401 16     Temp 01/14/20 1401 100.1 F (37.8 C)     Temp Source 01/14/20 1401 Oral     SpO2 01/14/20 1401 100 %     Weight --  Height --      Head Circumference --      Peak Flow --      Pain Score 01/14/20 1358 7     Pain Loc --      Pain Edu? --      Excl. in GC? --    No data found.  Updated Vital Signs BP 139/86 (BP Location: Right Arm)   Pulse 99   Temp 100.1 F (37.8 C) (Oral)   Resp 16   SpO2 100%    Physical Exam Constitutional:      General: She is not in acute distress.    Appearance: Normal appearance. She is not ill-appearing.  HENT:     Mouth/Throat:     Mouth: Mucous membranes are moist.  Cardiovascular:     Pulses: Normal pulses.  Pulmonary:     Effort: Pulmonary effort is normal.  Skin:    General: Skin is warm and dry.     Capillary  Refill: Capillary refill takes less than 2 seconds.     Comments: Swelling and erythema present around lateral nailbed of left middle finger with dried drainage on lateral aspect.  Skin erythema extending from finger to elbow in streak-like pattern as pictured below.  Skin warm to touch in comparison to right arm.   Neurological:     General: No focal deficit present.     Mental Status: She is alert and oriented to person, place, and time.            UC Treatments / Results  Labs (all labs ordered are listed, but only abnormal results are displayed) Labs Reviewed - No data to display  EKG   Radiology No results found.  Procedures Procedures (including critical care time)  Medications Ordered in UC Medications  cefTRIAXone (ROCEPHIN) injection 1,000 mg (1,000 mg Intramuscular Given 01/14/20 1514)    Initial Impression / Assessment and Plan / UC Course  I have reviewed the triage vital signs and the nursing notes.  Pertinent labs & imaging results that were available during my care of the patient were reviewed by me and considered in my medical decision making (see chart for details).   Otherwise healthy 34 year old female presenting for evaluation of an acute finger lesion with spreading erythema, consistent with paronychia and subsequent left arm cellulitis.  Elevated temperature of 100.54F in the clinic today, but otherwise hemodynamically stable and well-appearing on exam thus will trial outpatient therapy.  Given rapid spreading of erythema, provided ceftriaxone IM today with outpatient Keflex course for an additional 5 days for suspected strep cellulitis.   Recommended close follow-up in the next two days via urgent care or with her primary care provider.  Tylenol/ibuprofen as needed for fever/discomfort.  May do warm water salt soaks and avoid nail picking.  Follow-up sooner if erythema spreading, persistently febrile > 24-48 hours following antibiotics, or developing  fatigue etc.  Final Clinical Impressions(s) / UC Diagnoses   Final diagnoses:  Cellulitis of left arm     Discharge Instructions     Wonderful to meet you today!  You have received antibiotics in the office today and we have sent an additional course of to your pharmacy for the next 5 days.  You can also continue soaks of your arm daily.  Tylenol and/or ibuprofen alternated every 4-6 hours to help with pain and fever.  Please follow-up in the next few days either via our office or your primary care provider to check in.  Follow-up sooner  if the redness continues to spread, persistent fever in the next 1-2 days, or feeling worsening fatigue.    ED Prescriptions    Medication Sig Dispense Auth. Provider   cephALEXin (KEFLEX) 500 MG capsule Take 1 capsule (500 mg total) by mouth 4 (four) times daily for 5 days. 20 capsule Allayne Stack, DO     PDMP not reviewed this encounter.   Allayne Stack, DO   [place], [place], [place] 01/14/20 1606

## 2020-01-14 NOTE — ED Triage Notes (Signed)
Patient presents to Urgent Care with complaints of left middle finger pain since a few days ago. 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 Since then, the redness and swelling has spread up her arm.

## 2020-01-14 NOTE — Discharge Instructions (Addendum)
Wonderful to meet you today!  You have received antibiotics in the office today and we have sent an additional course of to your pharmacy for the next 5 days.  You can also continue soaks of your arm daily.  Tylenol and/or ibuprofen alternated every 4-6 hours to help with pain and fever.  Please follow-up in the next few days either via our office or your primary care provider to check in.  Follow-up sooner if the redness continues to spread, persistent fever in the next 1-2 days, or feeling worsening fatigue.

## 2021-03-03 ENCOUNTER — Other Ambulatory Visit: Payer: Self-pay | Admitting: Dentistry

## 2021-03-03 DIAGNOSIS — M26631 Articular disc disorder of right temporomandibular joint: Secondary | ICD-10-CM

## 2021-03-15 ENCOUNTER — Other Ambulatory Visit: Payer: BC Managed Care – PPO

## 2021-03-16 ENCOUNTER — Other Ambulatory Visit: Payer: Self-pay

## 2021-03-16 ENCOUNTER — Ambulatory Visit
Admission: RE | Admit: 2021-03-16 | Discharge: 2021-03-16 | Disposition: A | Discharge status: Home / Self Care | Payer: BC Managed Care – PPO | Source: Ambulatory Visit | Attending: Dentistry | Admitting: Dentistry

## 2021-03-16 DIAGNOSIS — M26631 Articular disc disorder of right temporomandibular joint: Secondary | ICD-10-CM

## 2021-03-19 ENCOUNTER — Other Ambulatory Visit: Payer: BC Managed Care – PPO

## 2022-07-18 IMAGING — MR MR [PERSON_NAME]
11 series · 48 of 48 positions shown · non-contrast
Comparison: None.

CLINICAL DATA: Right greater than left TMJ pain for the past 5
months.

EXAM:
MRI OF TEMPOROMANDIBULAR JOINT WITHOUT CONTRAST
TECHNIQUE: Multiplanar, multisequence MR imaging of the temporomandibular joint
was performed following the standard protocol. No intravenous
contrast was administered.

[Series 5: T1 · axial · 4.0mm · 0.59mm/px · z∈[-32,+16]mm · 5 of 13 slices shown]
[im 1/13]
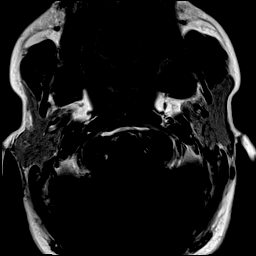
[im 4/13]
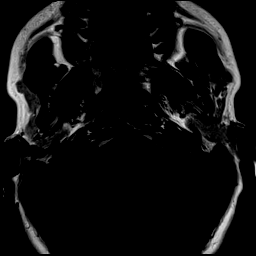
[im 7/13]
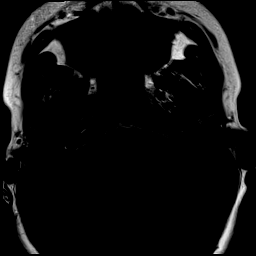
[im 10/13]
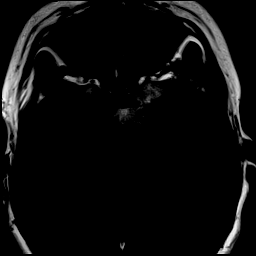
[im 13/13]
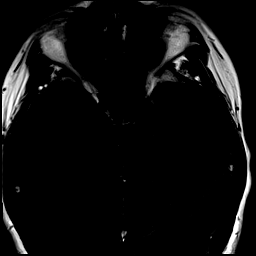

[Series 6: T2 fat-sat · sagittal · 4.0mm · 0.62mm/px · 4 of 11 slices shown (1 of 2)]
[im 1/11]
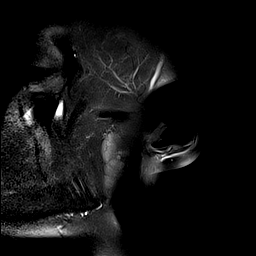
[im 4/11]
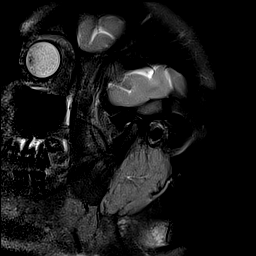
[im 7/11]
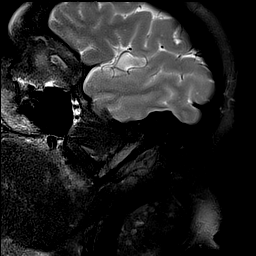
[im 11/11]
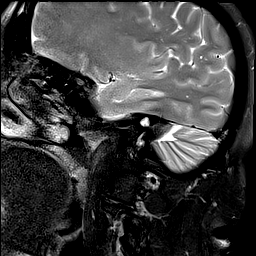

[Series 7: PD · coronal · 4.0mm · 0.44mm/px · 5 of 13 slices shown (1 of 8)]
[im 1/13]
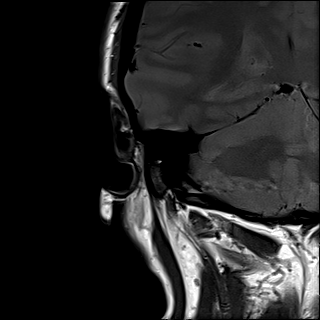
[im 4/13]
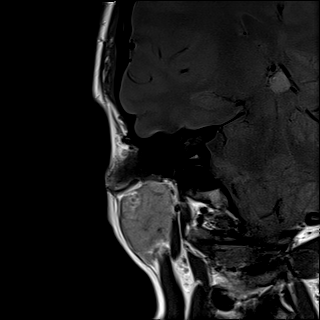
[im 7/13]
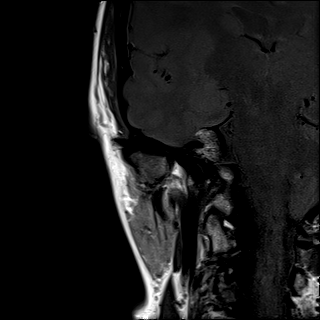
[im 10/13]
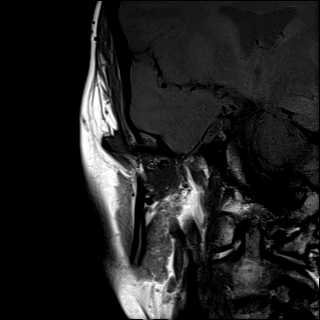
[im 13/13]
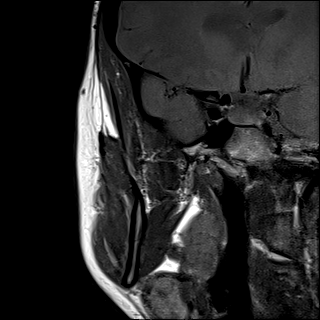

[Series 8: PD · coronal · 4.0mm · 0.44mm/px · 5 of 13 slices shown (2 of 8)]
[im 1/13]
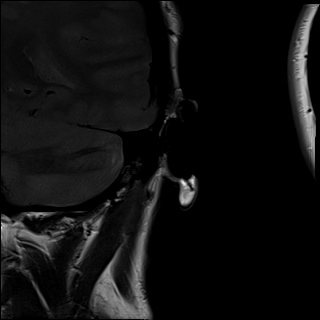
[im 4/13]
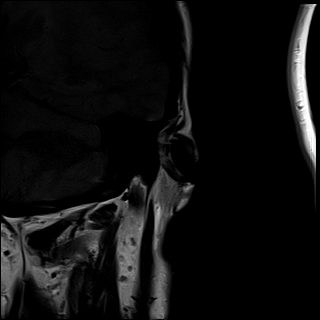
[im 7/13]
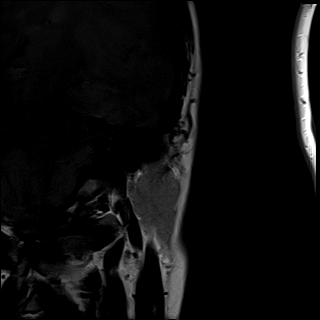
[im 10/13]
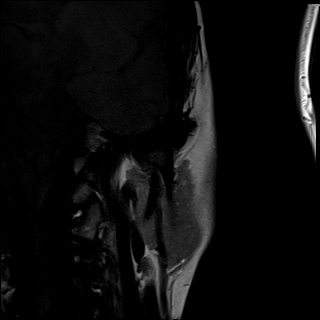
[im 13/13]
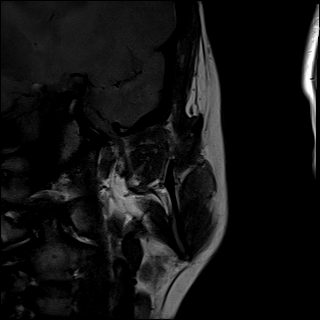

[Series 9: T2 fat-sat · sagittal · 4.0mm · 0.62mm/px · 4 of 11 slices shown (2 of 2)]
[im 1/11]
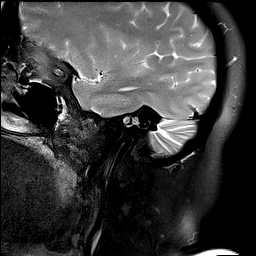
[im 4/11]
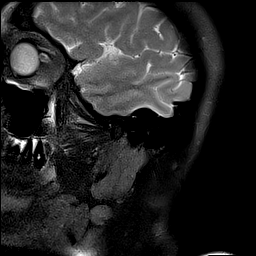
[im 7/11]
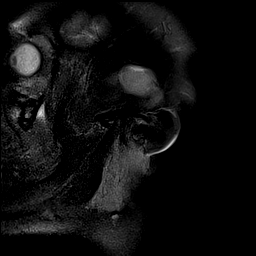
[im 11/11]
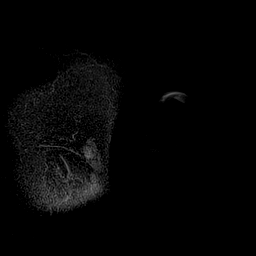

[Series 10: PD · sagittal · 4.0mm · 0.44mm/px · 4 of 11 slices shown (3 of 8)]
[im 1/11]
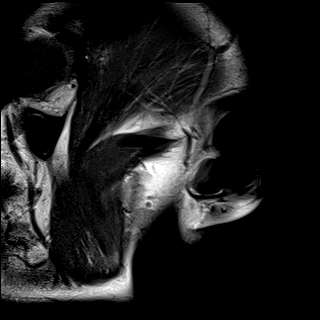
[im 4/11]
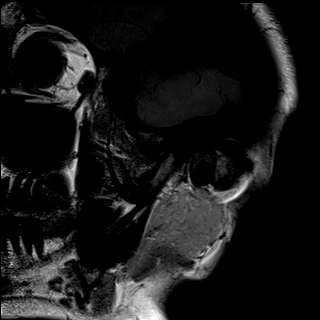
[im 7/11]
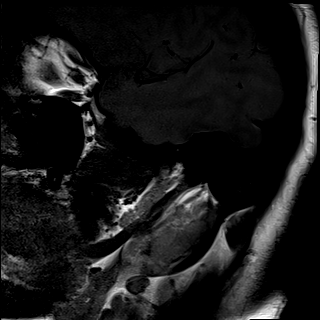
[im 11/11]
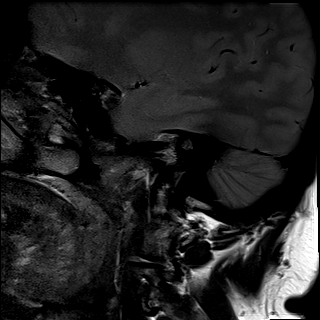

[Series 11: PD · sagittal · 4.0mm · 0.44mm/px · 3 of 9 slices shown (4 of 8)]
[im 1/9]
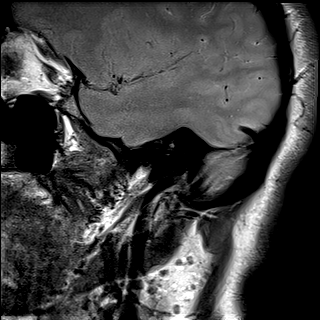
[im 5/9]
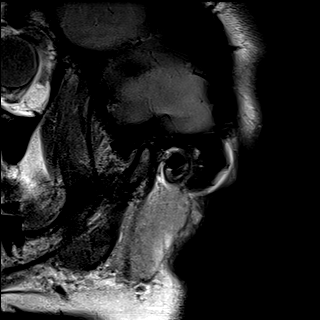
[im 9/9]
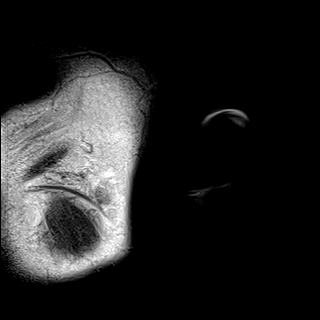

[Series 13: PD · sagittal · 4.0mm · 0.44mm/px · 4 of 11 slices shown (5 of 8)]
[im 1/11]
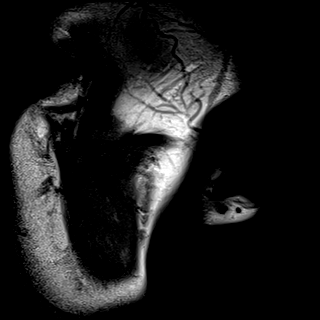
[im 4/11]
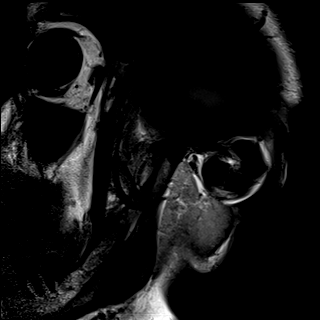
[im 7/11]
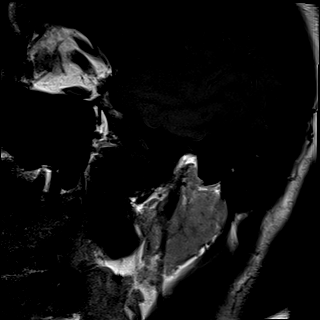
[im 11/11]
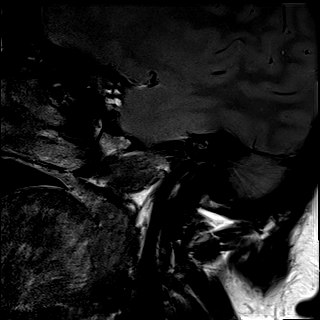

[Series 14: PD · sagittal · 4.0mm · 0.44mm/px · 4 of 11 slices shown (6 of 8)]
[im 1/11]
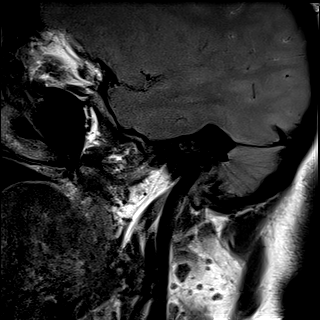
[im 4/11]
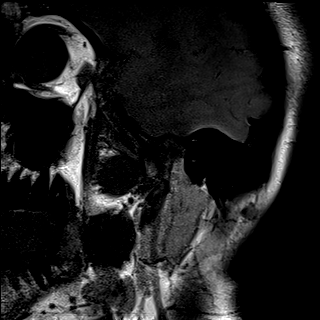
[im 7/11]
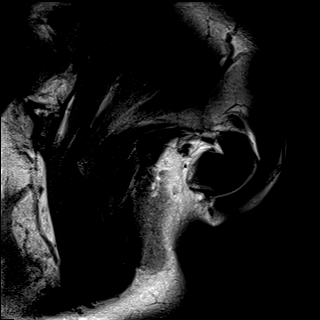
[im 11/11]
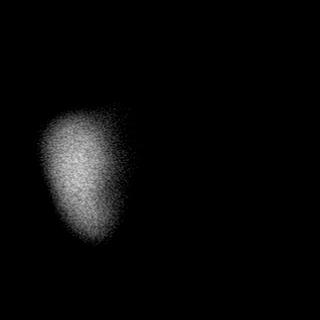

[Series 15: PD · coronal · 4.0mm · 0.44mm/px · 5 of 13 slices shown (7 of 8)]
[im 1/13]
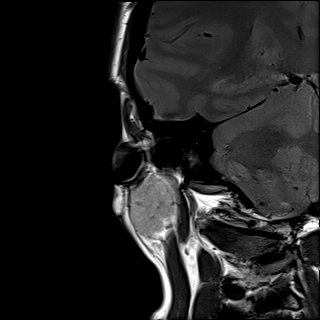
[im 4/13]
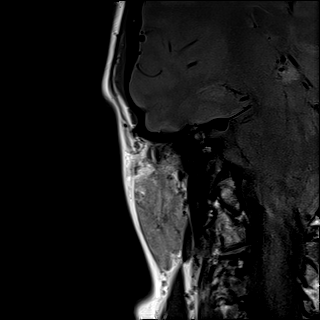
[im 7/13]
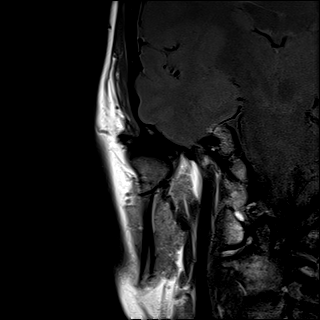
[im 10/13]
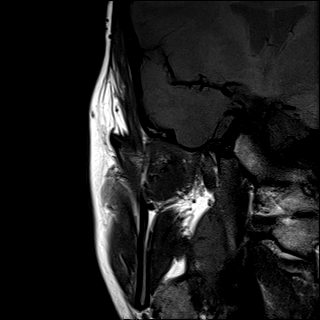
[im 13/13]
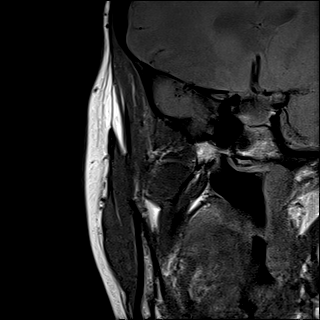

[Series 16: PD · coronal · 4.0mm · 0.44mm/px · 5 of 13 slices shown (8 of 8)]
[im 1/13]
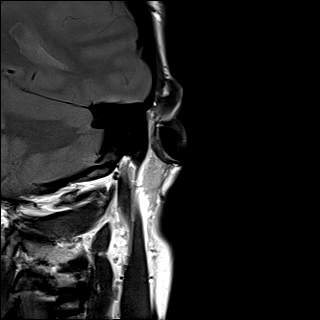
[im 4/13]
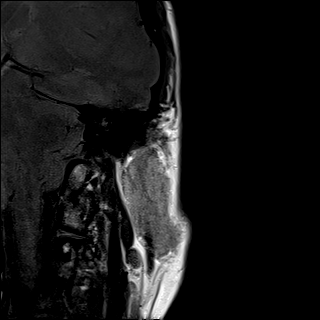
[im 7/13]
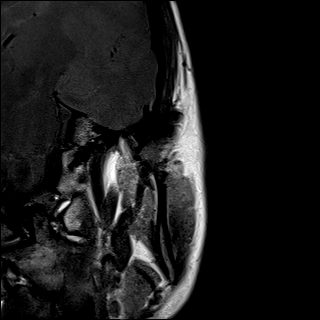
[im 10/13]
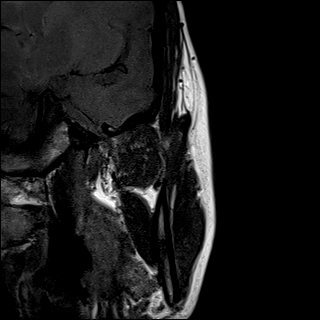
[im 13/13]
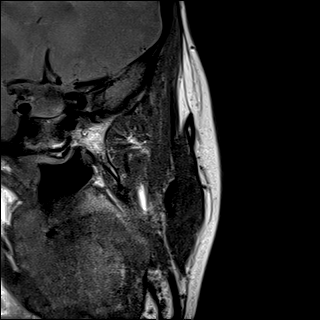

[48 of 48 positions shown; findings below may reference images not displayed]

FINDINGS: Right temporomandibular joint: The articular disc is normally
positioned between the mandibular condyle and the temporal bone in
both open and closed positions.There is normal anterior translation
of the mandibular condyle with jaw opening.There is no joint
effusion.

Left temporomandibular joint: There is mild anterior subluxation of
the articular disc in the closed position with deformity of the
posterior disc (series 9, image 6; series 11, image 5). The
articular disc is normally positioned between the mandibular condyle
and the temporal bone in the open position.There is normal anterior
translation of the mandibular condyle with jaw opening.There is no
joint effusion.

Other: None.
IMPRESSION: 1. Mild anterior subluxation of the left articular disc in the
closed position with deformity of the posterior disc. Normal disc
positioning with the mouth open.
2. Normal appearing right TMJ.
